# Patient Record
Sex: Female | Born: 2015 | Race: White | Marital: Single | Smoking: Never smoker
Health system: Northeastern US, Academic
[De-identification: ages and names within clinical notes are randomized; demographics above are authoritative.]

## PROBLEM LIST (undated history)

## (undated) DIAGNOSIS — K219 Gastro-esophageal reflux disease without esophagitis: Secondary | ICD-10-CM

## (undated) DIAGNOSIS — J45909 Unspecified asthma, uncomplicated: Secondary | ICD-10-CM

## (undated) HISTORY — DX: Gastro-esophageal reflux disease without esophagitis: K21.9

---
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
## 8253-08-28 DEATH — deceased
